# Patient Record
Sex: Male | Born: 1990 | Race: White | Hispanic: No | Marital: Married | Smoking: Never smoker
Health system: Southern US, Community
[De-identification: ages and names within clinical notes are randomized; demographics above are authoritative.]

## PROBLEM LIST (undated history)

## (undated) DIAGNOSIS — G4730 Sleep apnea, unspecified: Secondary | ICD-10-CM

## (undated) HISTORY — PX: TONSILLECTOMY AND ADENOIDECTOMY: SHX28

---

## 2017-06-12 ENCOUNTER — Other Ambulatory Visit: Payer: Self-pay

## 2017-06-12 ENCOUNTER — Encounter: Payer: Self-pay | Admitting: Emergency Medicine

## 2017-06-12 DIAGNOSIS — Y9301 Activity, walking, marching and hiking: Secondary | ICD-10-CM | POA: Insufficient documentation

## 2017-06-12 DIAGNOSIS — S99911A Unspecified injury of right ankle, initial encounter: Secondary | ICD-10-CM | POA: Diagnosis present

## 2017-06-12 DIAGNOSIS — W010XXA Fall on same level from slipping, tripping and stumbling without subsequent striking against object, initial encounter: Secondary | ICD-10-CM | POA: Diagnosis not present

## 2017-06-12 DIAGNOSIS — S82831A Other fracture of upper and lower end of right fibula, initial encounter for closed fracture: Secondary | ICD-10-CM | POA: Diagnosis not present

## 2017-06-12 DIAGNOSIS — Y929 Unspecified place or not applicable: Secondary | ICD-10-CM | POA: Diagnosis not present

## 2017-06-12 DIAGNOSIS — Y999 Unspecified external cause status: Secondary | ICD-10-CM | POA: Diagnosis not present

## 2017-06-12 DIAGNOSIS — S93491A Sprain of other ligament of right ankle, initial encounter: Secondary | ICD-10-CM | POA: Diagnosis not present

## 2017-06-12 NOTE — ED Triage Notes (Addendum)
Pt c/o right ankle pain after stepping out of his shed and twisting ankle; pt says ankle too painful to bear weight; swelling present; pt took Ibuprofen at 2300

## 2017-06-13 ENCOUNTER — Emergency Department
Admission: EM | Admit: 2017-06-13 | Discharge: 2017-06-13 | Disposition: A | Discharge status: Home / Self Care | Payer: PRIVATE HEALTH INSURANCE | Attending: Emergency Medicine | Admitting: Emergency Medicine

## 2017-06-13 ENCOUNTER — Emergency Department: Payer: PRIVATE HEALTH INSURANCE

## 2017-06-13 DIAGNOSIS — S82831A Other fracture of upper and lower end of right fibula, initial encounter for closed fracture: Secondary | ICD-10-CM

## 2017-06-13 DIAGNOSIS — S93491A Sprain of other ligament of right ankle, initial encounter: Secondary | ICD-10-CM

## 2017-06-13 HISTORY — DX: Sleep apnea, unspecified: G47.30

## 2017-06-13 NOTE — ED Provider Notes (Signed)
[facility] Emergency Department Provider Note  ____________________________________________   First MD Initiated Contact with Patient 06/13/17 [phone]     (approximate)  I have reviewed the triage vital signs and the nursing notes.   HISTORY  Chief Complaint Ankle Pain   HPI Angel Hopkins is a 27 y.o. male who self presents to the emergency department with sudden onset severe right lateral ankle pain that happened when he slipped walking out of the shed.  He has been able to ambulate since.  He has had no knee pain.  He denies numbness or weakness.  Nothing seems to make the pain better but is clearly worse when ambulating.  Past Medical History:  Diagnosis Date  . Sleep apnea     There are no active problems to display for this patient.   Past Surgical History:  Procedure Laterality Date  . TONSILLECTOMY AND ADENOIDECTOMY      Prior to Admission medications   Not on File    Allergies Patient has no known allergies.  History reviewed. No pertinent family history.  Social History Social History   Tobacco Use  . Smoking status: Never Smoker  . Smokeless tobacco: Never Used  Substance Use Topics  . Alcohol use: Yes  . Drug use: No    Review of Systems Constitutional: No fever/chills ENT: No sore throat. Cardiovascular: Denies chest pain. Respiratory: Denies shortness of breath. Gastrointestinal: No abdominal pain.  No nausea, no vomiting.  No diarrhea.  No constipation. Musculoskeletal: Negative for back pain. Neurological: Negative for headaches   ____________________________________________   PHYSICAL EXAM:  VITAL SIGNS: ED Triage Vitals  Enc Vitals Group     BP 06/12/17 2345 117/83     Pulse Rate 06/12/17 2345 (!) 119     Resp 06/12/17 2345 18     Temp 06/12/17 2345 98.7 F (37.1 C)     Temp Source 06/12/17 2345 Oral     SpO2 06/12/17 2345 98 %     Weight 06/12/17 2341 235 lb (106.6 kg)     Height 06/12/17 2341 6\' 2"   (1.88 m)     Head Circumference --      Peak Flow --      Pain Score 06/12/17 2341 7     Pain Loc --      Pain Edu? --      Excl. in GC? --     Constitutional: Alert and oriented x4 well-appearing nontoxic no diaphoresis speaks in full clear sentences Head: Atraumatic. Nose: No congestion/rhinnorhea. Mouth/Throat: No trismus Neck: No stridor.   Cardiovascular: Regular rate and rhythm Respiratory: Normal respiratory effort.  No retractions. MSK: Swelling and quite tender over lateral malleolus.  None over the medial.  He is also tender over the ATFL on the right No tenderness over navicular, midfoot, or fifth metatarsal 2+ dorsalis pedis pulse Skin closed Compartments soft Patient can fire extensor hallucis longus, extensor digitorum longus, flexor hallucis longus, flexor digitorum longus, tibialis anterior, and gastrocnemius Sensation intact to light touch to sural, saphenous, deep peroneal, superficial peroneal, and tibial nerve  Neurologic:  Normal speech and language. No gross focal neurologic deficits are appreciated.  Skin:  Skin is warm, dry and intact. No rash noted.    ____________________________________________  LABS (all labs ordered are listed, but only abnormal results are displayed)  Labs Reviewed - No data to display   __________________________________________  EKG   ____________________________________________  RADIOLOGY  X-ray of the ankle reviewed by me with possible avulsion fracture  at the tip of the fibula ____________________________________________   DIFFERENTIAL includes but not limited to  Ankle sprain, ankle fracture, ankle dislocation   PROCEDURES  Procedure(s) performed: no  Procedures  Critical Care performed: no  Observation: no ____________________________________________   INITIAL IMPRESSION / ASSESSMENT AND PLAN / ED COURSE  Pertinent labs & imaging results that were available during my care of the patient were  reviewed by me and considered in my medical decision making (see chart for details).  The patient arrives neurovascularly intact with exquisite tenderness over ATFL.  X-ray shows lateral soft tissue swelling and a possible avulsion fracture.  He is able to bear weight without difficulty.  He feels improved after Ace wrap.  At this point the patient is medically stable for outpatient management with orthopedic follow-up as needed.  He verbalizes understanding and agreement with the plan.      ____________________________________________   FINAL CLINICAL IMPRESSION(S) / ED DIAGNOSES  Final diagnoses:  None      NEW MEDICATIONS STARTED DURING THIS VISIT:  This SmartLink is deprecated. Use AVSMEDLIST instead to display the medication list for a patient.   Note:  This document was prepared using Dragon voice recognition software and may include unintentional dictation errors.      Merrily Brittleifenbark, Brigida Scotti, MD 06/13/17 [phone]

## 2017-06-13 NOTE — Discharge Instructions (Signed)
Please take 600 mg of ibuprofen up to 3 times a day as needed for pain and make sure you purchase an over-the-counter ankle splint to help with your symptoms.  Follow-up with orthopedic surgery in 1 week as needed.  It was a pleasure to take care of you today, and thank you for coming to our emergency department.  If you have any questions or concerns before leaving please ask the nurse to grab me and I'm more than happy to go through your aftercare instructions again.  If you were prescribed any opioid pain medication today such as Norco, Vicodin, Percocet, morphine, hydrocodone, or oxycodone please make sure you do not drive when you are taking this medication as it can alter your ability to drive safely.  If you have any concerns once you are home that you are not improving or are in fact getting worse before you can make it to your follow-up appointment, please do not hesitate to call [phone] and come back for further evaluation.  Merrily BrittleNeil Ailis Rigaud, MD  No results found for this or any previous visit. Dg Ankle Complete Right  Result Date: 06/13/2017 CLINICAL DATA:  Right ankle pain after twisting injury walking out of shed. Swelling, painful weight-bearing. EXAM: RIGHT ANKLE - COMPLETE 3+ VIEW COMPARISON:  None. FINDINGS: Possible tiny distal fibular tip avulsion. Large amount of lateral soft tissue edema. No additional acute fracture. The ankle mortise is preserved. Small joint effusion suspected. IMPRESSION: Lateral soft tissue edema with possible tiny distal fibular tip avulsion fracture. Electronically Signed   By: Rubye OaksMelanie  Ehinger M.D.   On: 06/13/2017 00:30

## 2018-10-03 IMAGING — CR DG ANKLE COMPLETE 3+V*R*
1 series · 3 of 3 positions shown · non-contrast
Comparison: None.

CLINICAL DATA: Right ankle pain after twisting injury walking out
of shed. Swelling, painful weight-bearing.

EXAM:
RIGHT ANKLE - COMPLETE 3+ VIEW

[Series 1: dg ankle complete right · 0.14mm/px · 3 of 3 slices shown]
[im 1/3]
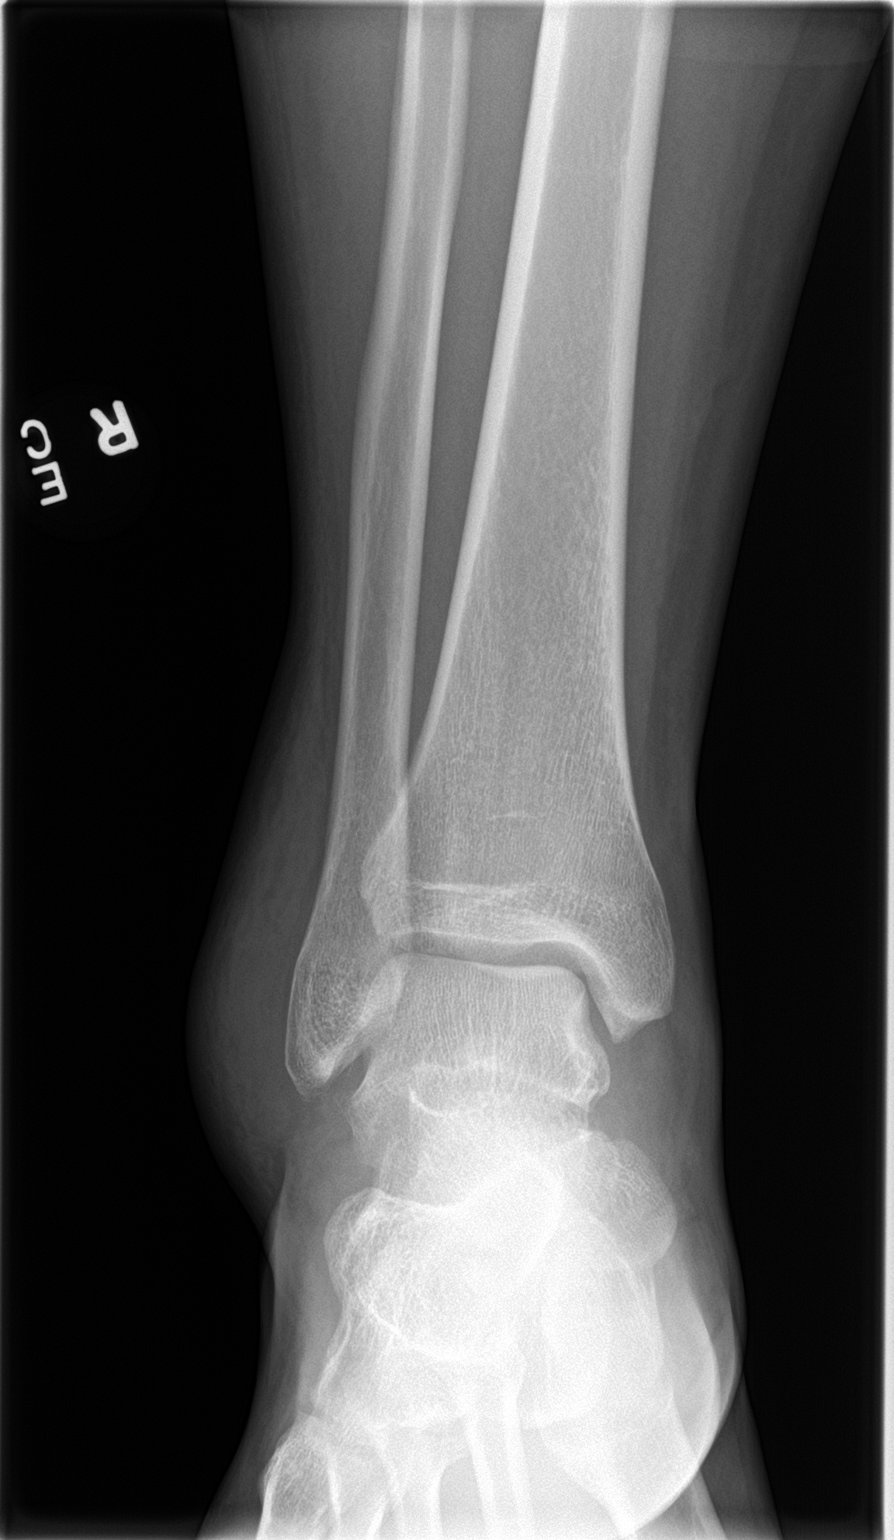
[im 2/3]
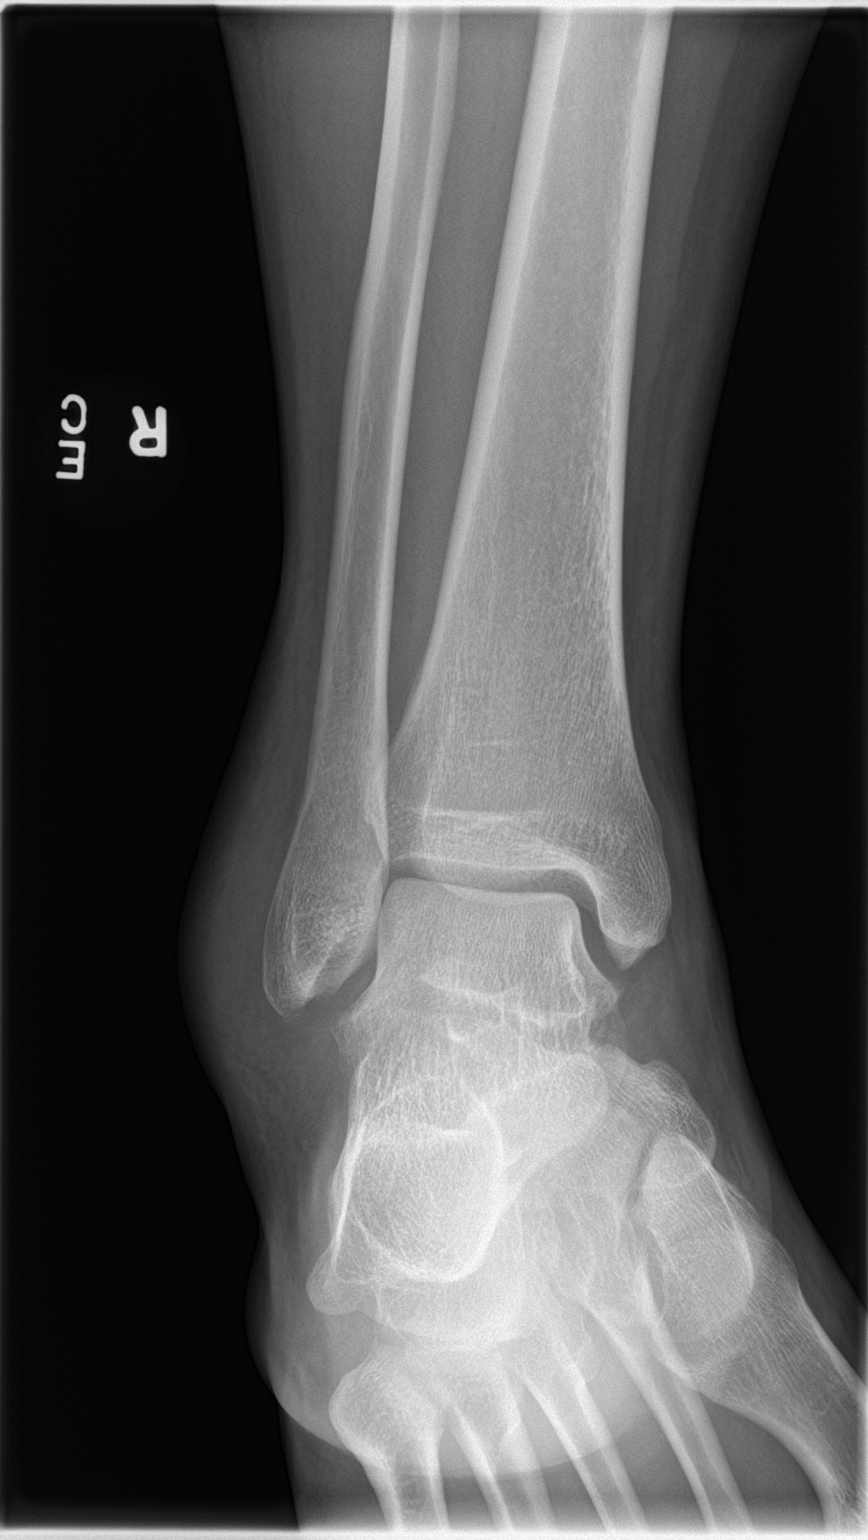
[im 3/3]
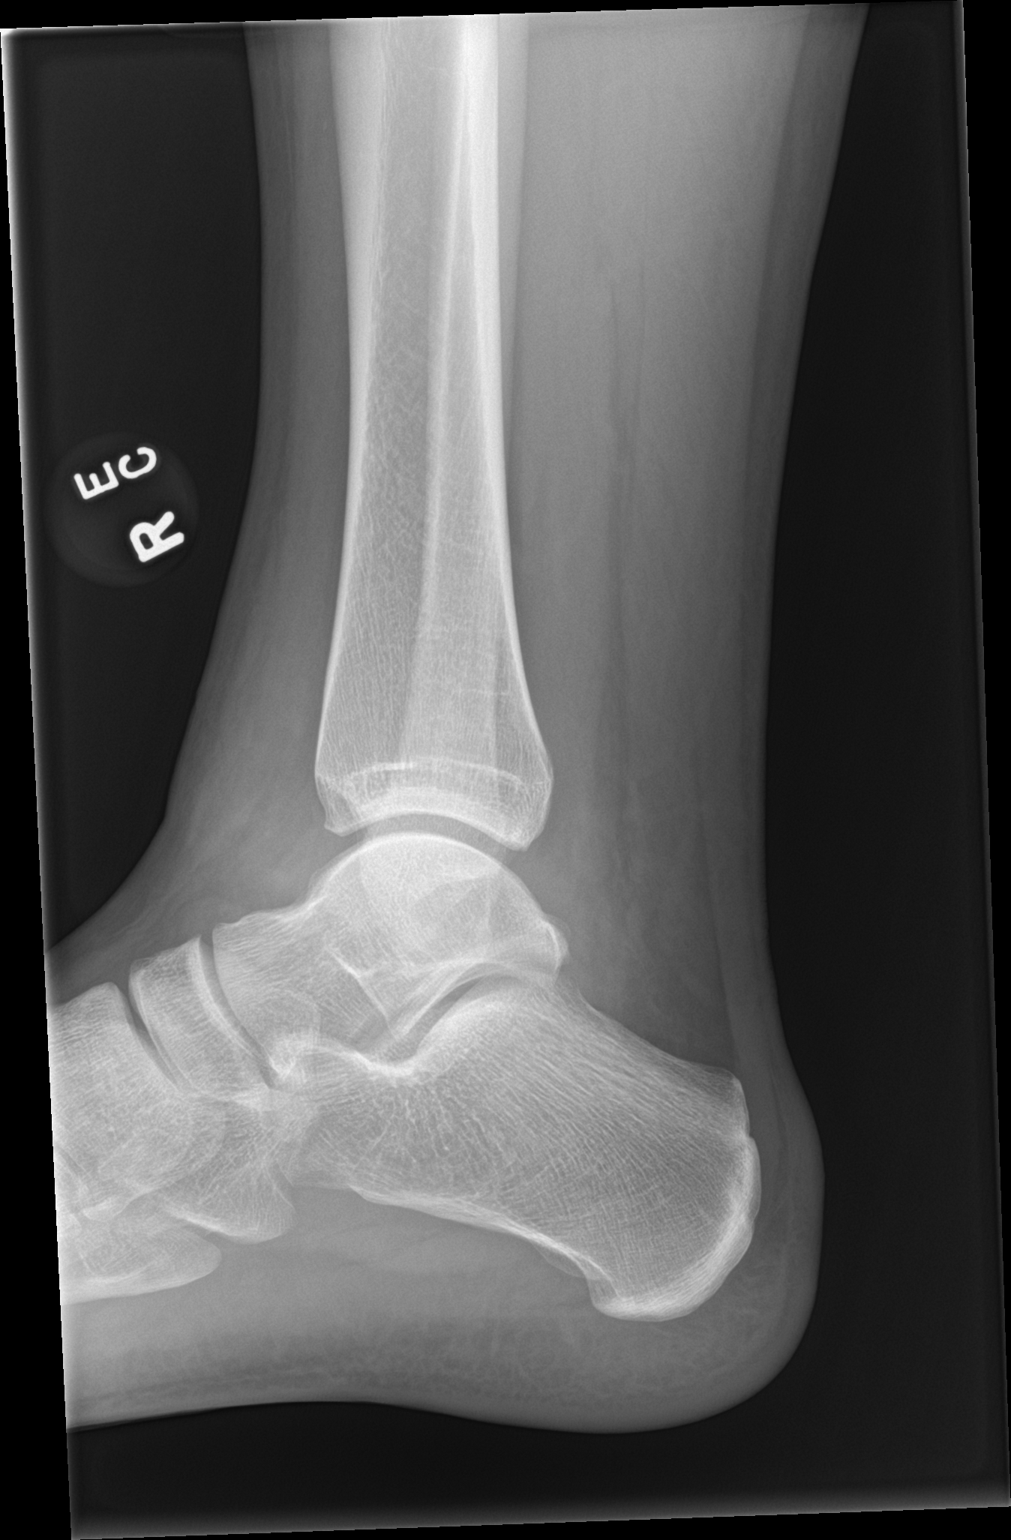

[3 of 3 positions shown; findings below may reference images not displayed]

FINDINGS: Possible tiny distal fibular tip avulsion. Large amount of lateral
soft tissue edema. No additional acute fracture. The ankle mortise
is preserved. Small joint effusion suspected.
IMPRESSION: Lateral soft tissue edema with possible tiny distal fibular tip
avulsion fracture.
